# Patient Record
Sex: Female | Born: 1995 | Hispanic: Yes | Marital: Married
Health system: Midwestern US, Community
[De-identification: ages and names within clinical notes are randomized; demographics above are authoritative.]

## PROBLEM LIST (undated history)

## (undated) DIAGNOSIS — O368120 Decreased fetal movements, second trimester, not applicable or unspecified: Secondary | ICD-10-CM

---

## 2016-03-05 ENCOUNTER — Inpatient Hospital Stay
Admit: 2016-03-05 | Discharge: 2016-03-05 | Disposition: A | Discharge status: Home / Self Care | Payer: BLUE CROSS/BLUE SHIELD | Attending: Emergency Medicine

## 2016-03-05 DIAGNOSIS — O9989 Other specified diseases and conditions complicating pregnancy, childbirth and the puerperium: Secondary | ICD-10-CM

## 2016-03-05 LAB — EKG 12-LEAD
Atrial Rate: 87 {beats}/min
Diagnosis: NORMAL
P Axis: 25 degrees
P-R Interval: 150 ms
Q-T Interval: 364 ms
QRS Duration: 82 ms
QTc Calculation (Bazett): 438 ms
T Axis: -5 degrees
Ventricular Rate: 87 {beats}/min

## 2016-03-05 LAB — METABOLIC PANEL, COMPREHENSIVE
A-G Ratio: 0.8 (ref 0.8–1.7)
ALT (SGPT): 15 U/L (ref 13–56)
AST (SGOT): 10 U/L — ABNORMAL LOW (ref 15–37)
Albumin: 3.1 g/dL — ABNORMAL LOW (ref 3.4–5.0)
Alk. phosphatase: 100 U/L (ref 45–117)
Anion gap: 10 mmol/L (ref 3.0–18)
BUN/Creatinine ratio: 16 (ref 12–20)
BUN: 7 MG/DL (ref 7.0–18)
Bilirubin, total: 0.2 MG/DL (ref 0.2–1.0)
CO2: 24 mmol/L (ref 21–32)
Calcium: 9.4 MG/DL (ref 8.5–10.1)
Chloride: 105 mmol/L (ref 100–108)
Creatinine: 0.43 MG/DL — ABNORMAL LOW (ref 0.6–1.3)
GFR est AA: >60 mL/min/{1.73_m2} (ref >60)
GFR est non-AA: >60 mL/min/{1.73_m2} (ref >60)
Globulin: 4.1 g/dL — ABNORMAL HIGH (ref 2.0–4.0)
Glucose: 92 mg/dL (ref 74–99)
Potassium: 3.6 mmol/L (ref 3.5–5.5)
Protein, total: 7.2 g/dL (ref 6.4–8.2)
Sodium: 139 mmol/L (ref 136–145)

## 2016-03-05 LAB — EKG, 12 LEAD, INITIAL
Atrial Rate: 87 {beats}/min
Calculated P Axis: 25 degrees
Calculated T Axis: -5 degrees
Diagnosis: NORMAL
P-R Interval: 150 ms
Q-T Interval: 364 ms
QRS Duration: 82 ms
QTC Calculation (Bezet): 438 ms
Ventricular Rate: 87 {beats}/min

## 2016-03-05 LAB — CBC WITH AUTOMATED DIFF
ABS. BASOPHILS: 0 10*3/uL (ref 0.0–0.06)
ABS. EOSINOPHILS: 0.1 10*3/uL (ref 0.0–0.4)
ABS. LYMPHOCYTES: 1.8 10*3/uL (ref 0.9–3.6)
ABS. MONOCYTES: 0.8 10*3/uL (ref 0.05–1.2)
ABS. NEUTROPHILS: 8.8 10*3/uL — ABNORMAL HIGH (ref 1.8–8.0)
BASOPHILS: 0 % (ref 0–2)
EOSINOPHILS: 1 % (ref 0–5)
HCT: 35 % (ref 35.0–45.0)
HGB: 11.8 g/dL — ABNORMAL LOW (ref 12.0–16.0)
LYMPHOCYTES: 16 % — ABNORMAL LOW (ref 21–52)
MCH: 29.7 PG (ref 24.0–34.0)
MCHC: 33.7 g/dL (ref 31.0–37.0)
MCV: 88.2 FL (ref 74.0–97.0)
MONOCYTES: 7 % (ref 3–10)
MPV: 10.5 FL (ref 9.2–11.8)
NEUTROPHILS: 76 % — ABNORMAL HIGH (ref 40–73)
PLATELET: 206 10*3/uL (ref 135–420)
RBC: 3.97 M/uL — ABNORMAL LOW (ref 4.20–5.30)
RDW: 14.3 % (ref 11.6–14.5)
WBC: 11.4 10*3/uL (ref 4.6–13.2)

## 2016-03-05 NOTE — ED Provider Notes (Signed)
HPI Comments: 5:15 AM Diana Howell is a 20 y.o. female with no relevant PMHx who is [redacted] weeks pregnant with twins presents to ED complaining of CP since yesterday and SOB since midnight. The pt states the CP is a stabbing pain in middle of her chest. Pt says the pain is exacerbated when she ambulating and sometimes laying down. The pt reports that though she has low back pain with  some pain that radiates down her legs, she has been dealing with this before pregnancy; currently pain is at baseline. The pt reports this is her 2nd pregnancy. Pt says she has no hx of blood clots.  Pt denies abdominal pain, pelvic pain, nausea, vomiting, travel, and leg selling. The pt had no other complaints or concerns in the ED.        PCP: No primary care provider on file.      The history is provided by the patient. No language interpreter was used.        Past Medical History:   Diagnosis Date   ??? Asthma        History reviewed. No pertinent surgical history.      History reviewed. No pertinent family history.    Social History     Social History   ??? Marital status: MARRIED     Spouse name: N/A   ??? Number of children: N/A   ??? Years of education: N/A     Occupational History   ??? Not on file.     Social History Main Topics   ??? Smoking status: Former Smoker   ??? Smokeless tobacco: Former Systems developer   ??? Alcohol use No   ??? Drug use: No   ??? Sexual activity: Not on file     Other Topics Concern   ??? Not on file     Social History Narrative   ??? No narrative on file         ALLERGIES: Amoxicillin and Penicillins    Review of Systems   Constitutional: Negative for chills and fever.   HENT: Negative for trouble swallowing.    Respiratory: Positive for shortness of breath.    Cardiovascular: Positive for chest pain. Negative for leg swelling.   Gastrointestinal: Negative for abdominal pain, diarrhea, nausea and vomiting.   Genitourinary: Negative for difficulty urinating.   Musculoskeletal: Negative for back pain and neck pain.    Skin: Negative for wound.   Neurological: Negative for syncope and headaches.   Psychiatric/Behavioral: Negative for behavioral problems.   All other systems reviewed and are negative.      Vitals:    03/05/16 0527 03/05/16 0530 03/05/16 0545 03/05/16 0600   BP: 113/87 108/51 94/63 114/74   Pulse: 97 86 95 81   Resp: _0 Temp: 98.7 ??F (37.1 ??C)      SpO2: 100% 100% 99% 98%   Weight: 74.8 kg (165 lb)      Height: 5' 3" (1.6 m)               Physical Exam   Constitutional: She is oriented to person, place, and time. She appears well-developed. No distress.   well-appearing, nad   HENT:   Head: Normocephalic and atraumatic.   Eyes: EOM are normal.   Neck: Normal range of motion.   Cardiovascular: Normal rate and intact distal pulses.    Pulmonary/Chest: Effort normal and breath sounds normal. No respiratory distress.   Abdominal: Soft. There is no tenderness.  Musculoskeletal: Normal range of motion. She exhibits no edema or tenderness.   Mechanically stable, negative homan's sign bilaterally   Neurological: She is alert and oriented to person, place, and time.   No focal deficits noted   Psychiatric: Her behavior is normal.   Nursing note and vitals reviewed.       MDM  Number of Diagnoses or Management Options  Chest pain, unspecified type:   Pregnancy test performed, pregnancy confirmed:   Diagnosis management comments: 20 yo CF G2P0 at 25 wks presents with a couple days intermittent chest pain and shortness of breath.  No fevers, no cough, no DVT symptoms.  Examination unremarkable with ctab and no increased wob and no leg swelling or tenderness with negative Homan's sign.  Presentations seems mostly likely non-specific, PE seems clinically unlikely as does coronary disease.  Will evaluate for acute process.  I discussed risks and benefits of imaging given pregnancy, will hold at this time.    6:28 AM  labwork unremarkable.  Pt doing well, has been sleeping comfortably in ED.   Given normal vital signs, exam and stay in ED, very low clinical suspicion for PE and again feel risk of imaging or further testing outweighs likely benefit, presentation more likely reflux/GERD/non-specific.  Discussed results with pt and poc for dc home, symptom management, follow-up, return precautions.       Amount and/or Complexity of Data Reviewed  Clinical lab tests: ordered and reviewed  Review and summarize past medical records: yes  Independent visualization of images, tracings, or specimens: yes      ED Course       EKG  Date/Time: 03/05/2016 5:23 AM  Performed by: Jeani Hawking  Authorized by: Paulla Dolly S     ECG reviewed by ED Physician in the absence of a cardiologist: yes    Previous ECG:     Previous ECG:  Unavailable  Interpretation:     Interpretation: normal    Rate:     ECG rate:  87    ECG rate assessment: normal    Rhythm:     Rhythm: sinus rhythm    Ectopy:     Ectopy: none    QRS:     QRS axis:  Normal    QRS intervals:  Normal  Conduction:     Conduction: normal    ST segments:     ST segments:  Normal  T waves:     T waves: normal          Vitals:  Patient Vitals for the past 12 hrs:   Temp Pulse Resp BP SpO2   03/05/16 0600 - 81 20 114/74 98 %   03/05/16 0545 - 95 20 94/63 99 %   03/05/16 0530 - 86 20 108/51 100 %   03/05/16 0527 98.7 ??F (37.1 ??C) 97 16 113/87 100 %       Medications Ordered:  Medications - No data to display    Lab Findings:  Recent Results (from the past 12 hour(s))   CBC WITH AUTOMATED DIFF    Collection Time: 03/05/16  4:50 AM   Result Value Ref Range    WBC 11.4 4.6 - 13.2 K/uL    RBC 3.97 (L) 4.20 - 5.30 M/uL    HGB 11.8 (L) 12.0 - 16.0 g/dL    HCT 35.0 35.0 - 45.0 %    MCV 88.2 74.0 - 97.0 FL    MCH 29.7 24.0 - 34.0 PG  MCHC 33.7 31.0 - 37.0 g/dL    RDW 14.3 11.6 - 14.5 %    PLATELET 206 135 - 420 K/uL    MPV 10.5 9.2 - 11.8 FL    NEUTROPHILS 76 (H) 40 - 73 %    LYMPHOCYTES 16 (L) 21 - 52 %    MONOCYTES 7 3 - 10 %    EOSINOPHILS 1 0 - 5 %     BASOPHILS 0 0 - 2 %    ABS. NEUTROPHILS 8.8 (H) 1.8 - 8.0 K/UL    ABS. LYMPHOCYTES 1.8 0.9 - 3.6 K/UL    ABS. MONOCYTES 0.8 0.05 - 1.2 K/UL    ABS. EOSINOPHILS 0.1 0.0 - 0.4 K/UL    ABS. BASOPHILS 0.0 0.0 - 0.06 K/UL    DF AUTOMATED     METABOLIC PANEL, COMPREHENSIVE    Collection Time: 03/05/16  4:50 AM   Result Value Ref Range    Sodium 139 136 - 145 mmol/L    Potassium 3.6 3.5 - 5.5 mmol/L    Chloride 105 100 - 108 mmol/L    CO2 24 21 - 32 mmol/L    Anion gap 10 3.0 - 18 mmol/L    Glucose 92 74 - 99 mg/dL    BUN 7 7.0 - 18 MG/DL    Creatinine 0.43 (L) 0.6 - 1.3 MG/DL    BUN/Creatinine ratio 16 12 - 20      GFR est AA >60 >60 ml/min/1.34m    GFR est non-AA >60 >60 ml/min/1.755m   Calcium 9.4 8.5 - 10.1 MG/DL    Bilirubin, total 0.2 0.2 - 1.0 MG/DL    ALT (SGPT) 15 13 - 56 U/L    AST (SGOT) 10 (L) 15 - 37 U/L    Alk. phosphatase 100 45 - 117 U/L    Protein, total 7.2 6.4 - 8.2 g/dL    Albumin 3.1 (L) 3.4 - 5.0 g/dL    Globulin 4.1 (H) 2.0 - 4.0 g/dL    A-G Ratio 0.8 0.8 - 1.7     EKG, 12 LEAD, INITIAL    Collection Time: 03/05/16  5:21 AM   Result Value Ref Range    Ventricular Rate 87 BPM    Atrial Rate 87 BPM    P-R Interval 150 ms    QRS Duration 82 ms    Q-T Interval 364 ms    QTC Calculation (Bezet) 438 ms    Calculated P Axis 25 degrees    Calculated T Axis -5 degrees    Diagnosis       Normal sinus rhythm  Moderate voltage criteria for LVH, may be normal variant  Nonspecific T wave abnormality  Abnormal ECG  No previous ECGs available           Diagnosis:   1. Chest pain, unspecified type    2. Pregnancy test performed, pregnancy confirmed        Disposition: Discharged    Follow-up Information     None           Patient's Medications   Start Taking    No medications on file   Continue Taking    PNV [id number] 29 MG IRON-1 MG -50 MG CPKD    Take  by mouth.   These Medications have changed    No medications on file   Stop Taking    No medications on file       [place] acting as a Education administrator for and in the presence of Jeani Hawking, MD      March 05, 2016 at 5:15 AM       Provider Attestation:      I personally performed the services described in the documentation, reviewed the documentation, as recorded by the scribe in my presence, and it accurately and completely records my words and actions. March 05, 2016 at 5:15 AM - Jeani Hawking, MD

## 2016-03-05 NOTE — ED Notes (Signed)
Assumed care of patient for discharge. I have reviewed discharge instructions with the patient.  The patient verbalized understanding. Patient armband removed and shredded. VSS

## 2016-03-05 NOTE — ED Triage Notes (Signed)
Alert female c/o stabbing cp since last noc, approx midnight +sob, reports 25 weeks and 5 days pregnant with twins. +nausea r/t pregnancy. Sx worse when laying flat and with deep inspiration.

## 2016-03-17 ENCOUNTER — Inpatient Hospital Stay: Payer: BLUE CROSS/BLUE SHIELD

## 2016-03-17 NOTE — Progress Notes (Signed)
Patient ambulatory to unit with c/o no fetal movement since 1100 on 03/16/2016. G2P0. 25 weeks. Denies leaking of vaginal fluids or bleeding. States positive fetal movement. EFM and Toco applied. Baby A FHR is 150. Baby B FHT is 150. Oriented to room and surroundings. Significant other supportive at bedside.

## 2016-03-17 NOTE — Progress Notes (Addendum)
Fetal movement noted X2.  Heart beats noted on both twins.  VS stable.  NO bleeding, leaking of fluid.  Will discharge upon MD order.    [id number] - Patient discharged to home with follow up to next scheduled appointment.  Discussed kick count and always consulting with provider if any concerns with her or the fetuses arises.  Patient voiced understanding.  Patient ambulated off unit accompanied by spouse.  No signs or symptoms of distress noted.

## 2016-03-17 NOTE — Progress Notes (Addendum)
1834: RN noticed Baby A & Baby B tracing together. Searched for Baby A from 1827 to 1833. Asked another RN to search for Baby A r/t noted patient anxiety.    1917: Dr. Kandra NicolasBasso in room for ultrasound. Twin A & Twin B are moving all over. OK to discharge home per Dr. Kandra NicolasBasso.

## 2016-03-17 NOTE — H&P (Signed)
Antepartum progress note    Patient seen, fetal heart rate and contraction pattern evaluated, patient examined.  No abdominal pain, no contractions, no LOF, no vaginal bleeding , able to feel baby B, but did not feel baby A   Patient Vitals for the past 8 hrs:   Pulse BP   03/17/16 1906 87 129/79         Physical Exam:   Abdomen soft , gravidic, not tender   Limited bedside US : A : on the left : +FM,SDP >2cm                                       B : on upper R : +FM , SDP >2cm  BPP: 10/10  Toco: no contractions          Assessment/Plan:  G2 P0 , 27w DiDi twins : reassuring fetal status x2, fup appoint 1/3. PTL precautions and FMC in place     Lasandra BeechAna C Karam Dunson, MD

## 2020-03-19 ENCOUNTER — Emergency Department
Admission: EM | Admit: 2020-03-19 | Discharge: 2020-03-19 | Disposition: A | Discharge status: Home / Self Care | Payer: Medicaid - Out of State | Attending: Emergency Medicine | Admitting: Emergency Medicine

## 2020-03-19 ENCOUNTER — Other Ambulatory Visit: Payer: Self-pay

## 2020-03-19 DIAGNOSIS — R55 Syncope and collapse: Secondary | ICD-10-CM | POA: Diagnosis not present

## 2020-03-19 DIAGNOSIS — R079 Chest pain, unspecified: Secondary | ICD-10-CM | POA: Insufficient documentation

## 2020-03-19 LAB — BASIC METABOLIC PANEL
Anion gap: 7 (ref 5–15)
BUN: 12 mg/dL (ref 6–20)
CO2: 23 mmol/L (ref 22–32)
Calcium: 9.2 mg/dL (ref 8.9–10.3)
Chloride: 108 mmol/L (ref 98–111)
Creatinine, Ser: 0.53 mg/dL (ref 0.44–1.00)
GFR, Estimated: >60 mL/min (ref >60)
Glucose, Bld: 92 mg/dL (ref 70–99)
Potassium: 3.8 mmol/L (ref 3.5–5.1)
Sodium: 138 mmol/L (ref 135–145)

## 2020-03-19 LAB — CBC
HCT: 40.1 % (ref 36.0–46.0)
Hemoglobin: 13.4 g/dL (ref 12.0–15.0)
MCH: 28.1 pg (ref 26.0–34.0)
MCHC: 33.4 g/dL (ref 30.0–36.0)
MCV: 84.1 fL (ref 80.0–100.0)
Platelets: 274 10*3/uL (ref 150–400)
RBC: 4.77 MIL/uL (ref 3.87–5.11)
RDW: 13.4 % (ref 11.5–15.5)
WBC: 7.7 10*3/uL (ref 4.0–10.5)
nRBC: 0 % (ref 0.0–0.2)

## 2020-03-19 LAB — TROPONIN I (HIGH SENSITIVITY)
Troponin I (High Sensitivity): <2 ng/L (ref <18)
Troponin I (High Sensitivity): <2 ng/L (ref <18)

## 2020-03-19 LAB — TSH: TSH: 0.792 u[IU]/mL (ref 0.350–4.500)

## 2020-03-19 MED ORDER — KETOROLAC TROMETHAMINE 30 MG/ML IJ SOLN
30.0000 mg | Freq: Once | INTRAMUSCULAR | Status: AC
  Administered 2020-03-19: 17:00:00 30 mg via INTRAMUSCULAR
  Filled 2020-03-19: qty 1

## 2020-03-19 MED ORDER — METOCLOPRAMIDE HCL 5 MG PO TABS: 5.0000 mg | ORAL_TABLET | Freq: Three times a day (TID) | ORAL | 0 refills | Status: AC | PRN

## 2020-03-19 MED ORDER — METOCLOPRAMIDE HCL 10 MG PO TABS
10.0000 mg | ORAL_TABLET | Freq: Once | ORAL | Status: AC
  Administered 2020-03-19: 17:00:00 10 mg via ORAL
  Filled 2020-03-19: qty 1

## 2020-03-19 NOTE — ED Provider Notes (Signed)
[facility] Emergency Department Provider Note ____________________________________________  Time seen: 1616  I have reviewed the triage vital signs and the nursing notes.  HISTORY  Chief Complaint  Chest Pain  HPI Jeanne Huffman is a 24 y.o. female presents to the ED via personal vehicle,  for evaluation of onset of chest pain and drowsiness.  Patient reports an undiagnosed "heart problem" for the last 3 years.  She describes intermittent episodes of tachycardia with associated near syncope.  She also reports episodes of sudden drop in her blood pressure.  She reports initial symptoms began about 3 years ago during her twin gestation.  She was evaluated by cardiologist once and of her pregnancy, but is unclear of her ongoing diagnosis.  She reports complete work-up which included a Holter monitor and echocardiogram.  According to the patient's recollection, there was some form of regurgitation reported on the echo exam.  She takes daily doses of propanolol for her heart rate, as well as Effexor, trazodone, and has recently had her Implanon replaced.  She denies any current nausea, vomiting, dizziness, weakness.  She reports generalized heaviness to the chest that persist.  She is currently not aware of any palpitations or skipped beats.  Patient also denies any current or recent alcohol or ilicit drug use.  History reviewed. No pertinent past medical history.  There are no problems to display for this patient.   History reviewed. No pertinent surgical history.  Prior to Admission medications   Medication Sig Start Date End Date Taking? Authorizing Provider  Etonogestrel (IMPLANON [place]) Inject into the skin. 11/22/19  Yes [provider]  metoCLOPramide (REGLAN) 5 MG tablet Take 1 tablet (5 mg total) by mouth every 8 (eight) hours as needed for up to 5 days for nausea or vomiting. 03/19/20 03/24/20 Yes Bobbi Kozakiewicz, Charlesetta Ivory, PA-C  propranolol ER (INDERAL LA) 60  MG 24 hr capsule Take 60 mg by mouth daily.   Yes [provider]  traZODone (DESYREL) 25 mg TABS tablet Take 25 mg by mouth at bedtime.   Yes [provider]  venlafaxine (EFFEXOR) 100 MG tablet Take 300 mg by mouth 2 (two) times daily.   Yes [provider]    Allergies Amoxicillin  History reviewed. No pertinent family history.  Social History    Review of Systems  Constitutional: Negative for fever. Eyes: Negative for visual changes. ENT: Negative for sore throat. Cardiovascular: Negative for chest pain. Respiratory: Negative for shortness of breath. Gastrointestinal: Negative for abdominal pain, vomiting and diarrhea. Genitourinary: Negative for dysuria. Musculoskeletal: Negative for back pain. Skin: Negative for rash. Neurological: Negative for headaches, focal weakness or numbness. ____________________________________________  PHYSICAL EXAM:  VITAL SIGNS: ED Triage Vitals  Enc Vitals Group     BP 03/19/20 1310 124/88     Pulse Rate 03/19/20 1310 85     Resp 03/19/20 1310 16     Temp 03/19/20 1310 98 F (36.7 C)     Temp Source 03/19/20 1310 Oral     SpO2 03/19/20 1310 100 %     Weight 03/19/20 1349 175 lb (79.4 kg)     Height 03/19/20 1349 5\' 3"  (1.6 m)     Head Circumference --      Peak Flow --      Pain Score 03/19/20 1349 8     Pain Loc --      Pain Edu? --      Excl. in GC? --     Constitutional: Alert  and oriented. Well appearing and in no distress. Head: Normocephalic and atraumatic. Eyes: Conjunctivae are normal. Normal extraocular movements Neck: Supple. No thyromegaly. Cardiovascular: Normal rate, regular rhythm. Normal distal pulses. Respiratory: Normal respiratory effort. No wheezes/rales/rhonchi. Gastrointestinal: Soft and nontender. No distention. Musculoskeletal: Nontender with normal range of motion in all extremities.  Neurologic:  Normal gait without ataxia. Normal speech and language. No gross focal  neurologic deficits are appreciated. Skin:  Skin is warm, dry and intact. No rash noted. Psychiatric: Mood and affect are normal. Patient exhibits appropriate insight and judgment. ____________________________________________   LABS (pertinent positives/negatives) Labs Reviewed  BASIC METABOLIC PANEL  CBC  TSH  TROPONIN I (HIGH SENSITIVITY)  TROPONIN I (HIGH SENSITIVITY)  ____________________________________________  EKG  NSR 83 ms with sinus arrythmia PR interval 156 ms QRS duration 84 ms No STEMI ____________________________________________  PROCEDURES  Toradol 30 mg IM metoclopramide 10 mg PO  Procedures ____________________________________________  INITIAL IMPRESSION / ASSESSMENT AND PLAN / ED COURSE  Differential diagnosis includes, but is not limited to, ACS, aortic dissection, pulmonary embolism, cardiac tamponade, pneumothorax, pneumonia, pericarditis, myocarditis, GI-related causes including esophagitis/gastritis, and musculoskeletal chest wall pain, POTS  Female who presents to the ED with an evaluation for near syncopal episode as well as tachycardia, is stable and reporting improved symptoms after ED work-up.  Labs are reassuring and troponin x2 are negative, and there is no signs of acute coronary syndrome on exam.  Patient's clinical picture may represent a POTS syndrome, anxiety, or some other underlying cardiac arrhythmia.  Patient is referred at this time to cardiology for further work-up since her symptoms have been persistent and intermittent for the last several years.  She is reassured by her normal work-up at this time, discharge follow-up with primary provider for ongoing symptoms.   Jeanne Huffman was evaluated in Emergency Department on 03/19/2020 for the symptoms described in the history of present illness. She was evaluated in the context of the global COVID-19 pandemic, which necessitated consideration that the patient might be at risk for infection  with the SARS-CoV-2 virus that causes COVID-19. Institutional protocols and algorithms that pertain to the evaluation of patients at risk for COVID-19 are in a state of rapid change based on information released by regulatory bodies including the CDC and federal and state organizations. These policies and algorithms were followed during the patient's care in the ED. ____________________________________________  FINAL CLINICAL IMPRESSION(S) / ED DIAGNOSES  Final diagnoses:  Nonspecific chest pain  Postural dizziness with near syncope      Karmen Stabs, Charlesetta Ivory, PA-C 03/19/20 2041    Chesley Noon, MD 03/25/20 [phone]

## 2020-03-19 NOTE — ED Triage Notes (Signed)
Pt comes pov with chest pain and sleepiness. Pt states "heart problem" for 3 years that still happens sometimes where her heart gets fast in her sleep and her BP drops or changes. On propranolol for HR. Denies drug use in past 2 weeks.

## 2020-03-19 NOTE — Discharge Instructions (Signed)
Your exam, labs, and EKG are normal and reassuring at this time. Continue to monitor and treat your symptoms. Follow-up with your selected primary care provider and Cardiology as discussed. Return to the ED as needed.

## 2020-07-01 ENCOUNTER — Other Ambulatory Visit: Payer: Self-pay

## 2020-07-01 ENCOUNTER — Emergency Department
Admission: EM | Admit: 2020-07-01 | Discharge: 2020-07-01 | Disposition: A | Discharge status: Home / Self Care | Payer: Medicaid - Out of State | Attending: Emergency Medicine | Admitting: Emergency Medicine

## 2020-07-01 ENCOUNTER — Emergency Department: Payer: Medicaid - Out of State

## 2020-07-01 ENCOUNTER — Encounter: Payer: Self-pay | Admitting: Emergency Medicine

## 2020-07-01 DIAGNOSIS — N83202 Unspecified ovarian cyst, left side: Secondary | ICD-10-CM

## 2020-07-01 DIAGNOSIS — R102 Pelvic and perineal pain: Secondary | ICD-10-CM

## 2020-07-01 DIAGNOSIS — M545 Low back pain, unspecified: Secondary | ICD-10-CM | POA: Diagnosis present

## 2020-07-01 LAB — URINALYSIS, COMPLETE (UACMP) WITH MICROSCOPIC
Bacteria, UA: NONE SEEN
Bilirubin Urine: NEGATIVE
Glucose, UA: NEGATIVE mg/dL
Hgb urine dipstick: NEGATIVE
Ketones, ur: NEGATIVE mg/dL
Leukocytes,Ua: NEGATIVE
Nitrite: NEGATIVE
Protein, ur: NEGATIVE mg/dL
Specific Gravity, Urine: 1.024 (ref 1.005–1.030)
pH: 5 (ref 5.0–8.0)

## 2020-07-01 LAB — WET PREP, GENITAL
Clue Cells Wet Prep HPF POC: NONE SEEN
Sperm: NONE SEEN
Trich, Wet Prep: NONE SEEN
Yeast Wet Prep HPF POC: NONE SEEN

## 2020-07-01 LAB — CHLAMYDIA/NGC RT PCR (ARMC ONLY)
Chlamydia Tr: NOT DETECTED
N gonorrhoeae: NOT DETECTED

## 2020-07-01 LAB — PREGNANCY, URINE: Preg Test, Ur: NEGATIVE

## 2020-07-01 MED ORDER — TRAMADOL HCL 50 MG PO TABS: 50.0000 mg | ORAL_TABLET | Freq: Four times a day (QID) | ORAL | 0 refills | Status: AC | PRN

## 2020-07-01 MED ORDER — ACETAMINOPHEN 325 MG PO TABS
650.0000 mg | ORAL_TABLET | Freq: Once | ORAL | Status: AC
  Administered 2020-07-01: 650 mg via ORAL
  Filled 2020-07-01: qty 2

## 2020-07-01 MED ORDER — ONDANSETRON 8 MG PO TBDP: 8.0000 mg | ORAL_TABLET | Freq: Three times a day (TID) | ORAL | 0 refills | Status: AC | PRN

## 2020-07-01 MED ORDER — IBUPROFEN 600 MG PO TABS: 600.0000 mg | ORAL_TABLET | Freq: Four times a day (QID) | ORAL | 0 refills | Status: AC | PRN

## 2020-07-01 MED ORDER — ONDANSETRON 4 MG PO TBDP
8.0000 mg | ORAL_TABLET | Freq: Once | ORAL | Status: AC
  Administered 2020-07-01: 8 mg via ORAL
  Filled 2020-07-01: qty 2

## 2020-07-01 NOTE — Discharge Instructions (Signed)
Return to the ER for new, worsening, or persistent severe pain, vomiting, diarrhea, vaginal bleeding, or any other new or worsening symptoms that concern you.  Follow-up with your regular OB/GYN in the next week.  Take the ibuprofen as the primary pain medication with the tramadol only if needed for more severe pain.

## 2020-07-01 NOTE — ED Notes (Signed)
Pt alert and oriented X 4, stable for discharge. RR even and unlabored, color WNL. Discussed discharge instructions and follow-up as directed. Discharge medications discussed if prescribed. Pt had opportunity to ask questions, and RN to provide patient/family eduction.  

## 2020-07-01 NOTE — ED Notes (Signed)
Pt presents to the ED with left sided, lower abdominal, and pelvic pain that started April 8. Pt complains of dysuria associated with urgency and frequency. Pt states that urine is dark and has a foul odor to it. Pt also states that they have been having white vaginal discharge with a thick consistency that started April 4. Pt also complains of nausea. Pt denies of fever. Pt is A&Ox4 and NAD.

## 2020-07-01 NOTE — ED Notes (Signed)
Pt requesting medication for nausea at this time. Siadecki, EDP made aware.

## 2020-07-01 NOTE — ED Triage Notes (Addendum)
Patient to ER for c/o lower back pain (left side worse) with white vaginal discharge and pelvic pain. Patient reports having one episode of dysuria. States s/s started 1.5 days ago. Denies any fevers. +Nausea.

## 2020-07-01 NOTE — ED Notes (Signed)
Pt to and from US.  Awaiting results.

## 2020-07-01 NOTE — ED Provider Notes (Signed)
[facility] Emergency Department Provider Note ____________________________________________   Event Date/Time   First MD Initiated Contact with Patient 07/01/20 [id number]     (approximate)  I have reviewed the triage vital signs and the nursing notes.   HISTORY  Chief Complaint Back Pain    HPI Jeanne Huffman is a 25 y.o. female with no active medical problems who presents with left lower back and suprapubic pain over the last 2 days, persistent course, and associated with slight dysuria initially which has now resolved.  The patient also has had a small amount of vaginal discharge over the last week which preceded the pain.   History reviewed. No pertinent past medical history.  There are no problems to display for this patient.   History reviewed. No pertinent surgical history.  Prior to Admission medications   Medication Sig Start Date End Date Taking? Authorizing Provider  ibuprofen (ADVIL) 600 MG tablet Take 1 tablet (600 mg total) by mouth every 6 (six) hours as needed. 07/01/20  Yes Dionne Bucy, MD  ondansetron (ZOFRAN ODT) 8 MG disintegrating tablet Take 1 tablet (8 mg total) by mouth every 8 (eight) hours as needed for nausea or vomiting. 07/01/20  Yes Dionne Bucy, MD  traMADol (ULTRAM) 50 MG tablet Take 1 tablet (50 mg total) by mouth every 6 (six) hours as needed for up to 5 days. 07/01/20 07/06/20 Yes Dionne Bucy, MD  Etonogestrel [facility]) Inject into the skin. 11/22/19   [provider]  metoCLOPramide (REGLAN) 5 MG tablet Take 1 tablet (5 mg total) by mouth every 8 (eight) hours as needed for up to 5 days for nausea or vomiting. 03/19/20 03/24/20  Menshew, Charlesetta Ivory, PA-C  propranolol ER (INDERAL LA) 60 MG 24 hr capsule Take 60 mg by mouth daily.    [provider]  traZODone (DESYREL) 25 mg TABS tablet Take 25 mg by mouth at bedtime.    [provider]  venlafaxine (EFFEXOR) 100 MG tablet  Take 300 mg by mouth 2 (two) times daily.    [provider]    Allergies Amoxicillin and Bee venom  No family history on file.  Social History Social History   Tobacco Use  . Smoking status: Never Smoker  . Smokeless tobacco: Never Used  Substance Use Topics  . Alcohol use: Never    Review of Systems  Constitutional: No fever. Eyes: No visual changes. ENT: No sore throat. Cardiovascular: Denies chest pain. Respiratory: Denies shortness of breath. Gastrointestinal: No vomiting or diarrhea. Genitourinary: Positive for resolved dysuria.  Positive for vaginal discharge. Musculoskeletal: Positive for back pain. Skin: Negative for rash. Neurological: Negative for headaches, focal weakness or numbness.   ____________________________________________   PHYSICAL EXAM:  VITAL SIGNS: ED Triage Vitals  Enc Vitals Group     BP 07/01/20 0859 124/80     Pulse Rate 07/01/20 0859 79     Resp 07/01/20 0859 18     Temp 07/01/20 0859 98.1 F (36.7 C)     Temp Source 07/01/20 0859 Oral     SpO2 07/01/20 0859 100 %     Weight 07/01/20 0900 170 lb (77.1 kg)     Height 07/01/20 0900 5\' 3"  (1.6 m)     Head Circumference --      Peak Flow --      Pain Score 07/01/20 0900 7     Pain Loc --      Pain Edu? --      Excl.  in GC? --     Constitutional: Alert and oriented. Well appearing and in no acute distress. Eyes: Conjunctivae are normal.  Head: Atraumatic. Nose: No congestion/rhinnorhea. Mouth/Throat: Mucous membranes are moist.   Neck: Normal range of motion.  Cardiovascular: Normal rate, regular rhythm.  Good peripheral circulation. Respiratory: Normal respiratory effort.  No retractions.  Gastrointestinal: Soft with mild suprapubic discomfort but no focal tenderness or peritoneal signs.  No distention.  Genitourinary: No flank tenderness.  Normal external genitalia.  Small amount of whitish vaginal discharge.  No significant CMT or adnexal  tenderness. Musculoskeletal: No lower extremity edema.  Extremities warm and well perfused.  Neurologic:  Normal speech and language. No gross focal neurologic deficits are appreciated.  Skin:  Skin is warm and dry. No rash noted. Psychiatric: Mood and affect are normal. Speech and behavior are normal.  ____________________________________________   LABS (all labs ordered are listed, but only abnormal results are displayed)  Labs Reviewed  WET PREP, GENITAL - Abnormal; Notable for the following components:      Result Value   WBC, Wet Prep HPF POC FEW (*)    All other components within normal limits  URINALYSIS, COMPLETE (UACMP) WITH MICROSCOPIC - Abnormal; Notable for the following components:   Color, Urine YELLOW (*)    APPearance HAZY (*)    All other components within normal limits  CHLAMYDIA/NGC RT PCR ([facility] ONLY)  PREGNANCY, URINE   ____________________________________________  EKG   ____________________________________________  RADIOLOGY  US pelvis: Left ovarian cyst.  ____________________________________________   PROCEDURES  Procedure(s) performed: No  Procedures  Critical Care performed: No ____________________________________________   INITIAL IMPRESSION / ASSESSMENT AND PLAN / ED COURSE  Pertinent labs & imaging results that were available during my care of the patient were reviewed by me and considered in my medical decision making (see chart for details).  25 year old female with no active medical problems presents with left lower back pain, suprapubic pain, and vaginal discharge over the last several days.  She had some dysuria initially which resolved.  On exam, the patient is overall well-appearing.  Her vital signs are normal.  The abdomen is soft with mild suprapubic discomfort but no focal tenderness.  Pelvic exam is unremarkable except for small amount of whitish discharge.  Differential includes UTI/pyonephritis, BV,  trichomoniasis, ruptured ovarian cyst, uterine fibroid; I do not suspect PID, TOA, or ovarian torsion given the reassuring pelvic exam.  The patient has no significant GI symptoms so I do not suspect diverticulitis or colitis.  We will obtain wet prep, GC/CT, urine pregnancy, and urinalysis.  ----------------------------------------- 12:31 PM on 07/01/2020 -----------------------------------------  Urinalysis is normal and the vaginal swabs are unrevealing.  I have ordered an ultrasound for further evaluation.  ----------------------------------------- 1:32 PM on 07/01/2020 -----------------------------------------  Ultrasound shows a left ovarian cyst which corresponds with the location of the patient's pain.  Patient appears comfortable and is eating.  There is no evidence of ovarian torsion or other acute complication.  At this time, the patient is stable for discharge home.  I counseled her on the results of the work-up.  I have prescribed analgesia and some Zofran for nausea.  Return precautions given, and she expresses understanding. ____________________________________________   FINAL CLINICAL IMPRESSION(S) / ED DIAGNOSES  Final diagnoses:  Pelvic pain  Left ovarian cyst      NEW MEDICATIONS STARTED DURING THIS VISIT:  Discharge Medication List as of 07/01/2020  1:47 PM    START taking these medications   Details  ibuprofen (  ADVIL) 600 MG tablet Take 1 tablet (600 mg total) by mouth every 6 (six) hours as needed., Starting Mon 07/01/2020, Normal    ondansetron (ZOFRAN ODT) 8 MG disintegrating tablet Take 1 tablet (8 mg total) by mouth every 8 (eight) hours as needed for nausea or vomiting., Starting Mon 07/01/2020, Normal    traMADol (ULTRAM) 50 MG tablet Take 1 tablet (50 mg total) by mouth every 6 (six) hours as needed for up to 5 days., Starting Mon 07/01/2020, Until Sat 07/06/2020 at 2359, Normal         Note:  This document was prepared using Dragon voice recognition  software and may include unintentional dictation errors.   Dionne Bucy, MD 07/01/20 [phone]

## 2022-03-14 IMAGING — US US PELVIS COMPLETE WITH TRANSVAGINAL
1 series · 13 of 25 positions shown · non-contrast
Comparison: None

CLINICAL DATA: Pelvic pain for 2 days.

EXAM:
TRANSABDOMINAL AND TRANSVAGINAL ULTRASOUND OF PELVIS
TECHNIQUE: Both transabdominal and transvaginal ultrasound examinations of the
pelvis were performed. Transabdominal technique was performed for
global imaging of the pelvis including uterus, ovaries, adnexal
regions, and pelvic cul-de-sac. It was necessary to proceed with
endovaginal exam following the transabdominal exam to visualize the
endometrium and ovaries to better advantage.

[Series 1: us pelvic complete with transvaginal · 154 acquisitions, 13 frames shown]
[im 1/154]
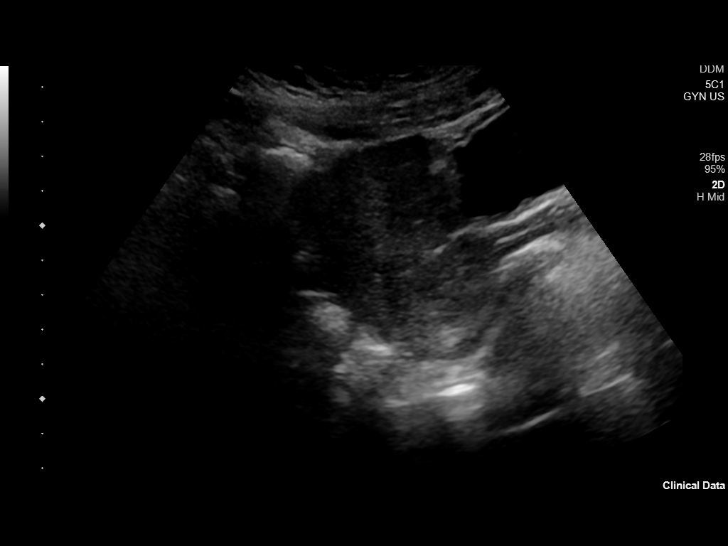
[im 13/154]
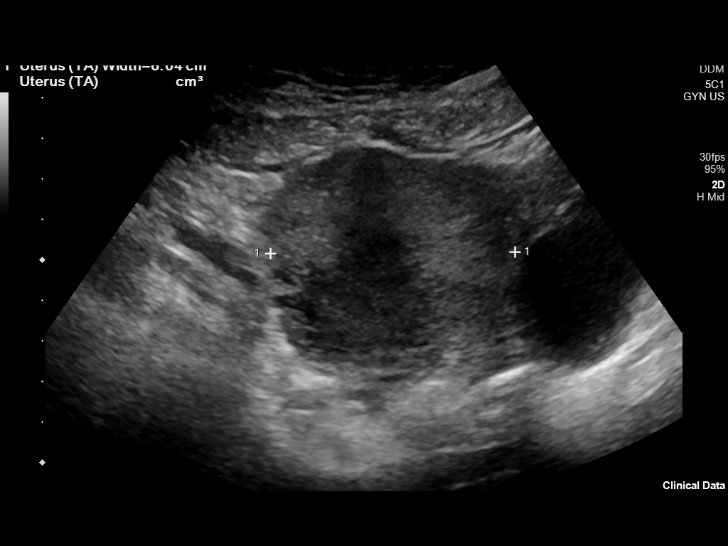
[im 26/154]
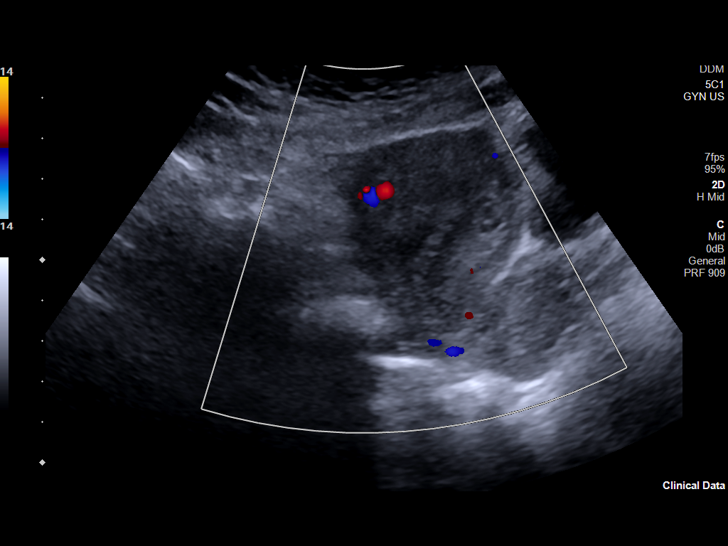
[im 39/154]
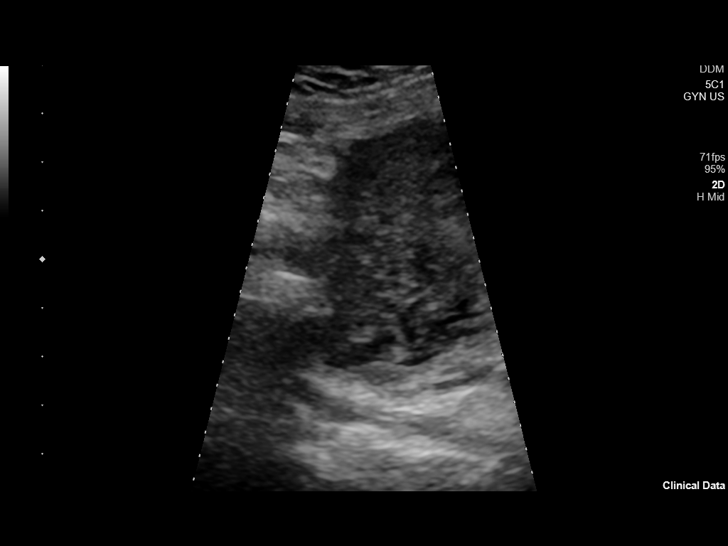
[im 52/154]
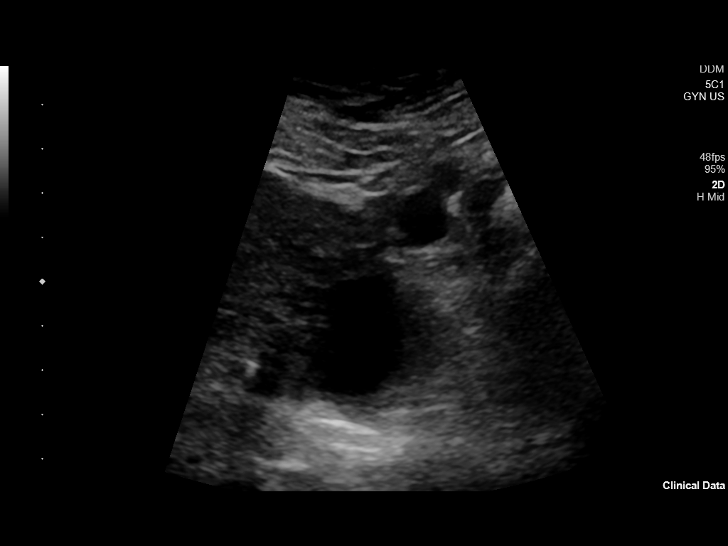
[im 64/154]
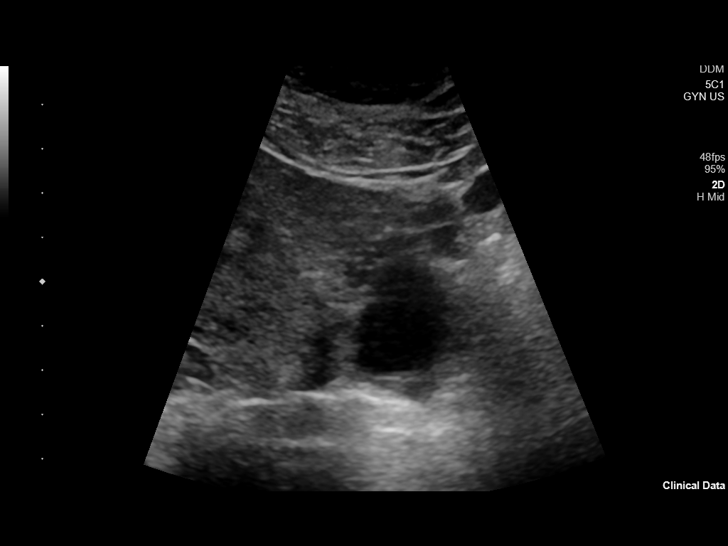
[im 77/154]
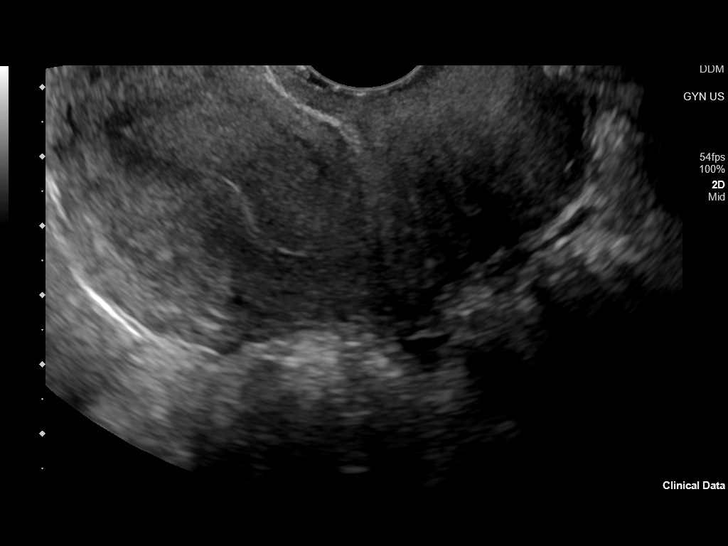
[im 90/154]
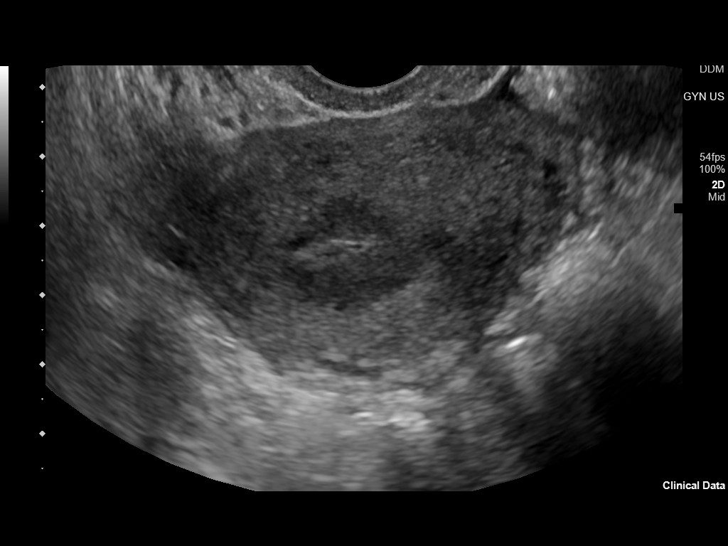
[im 103/154]
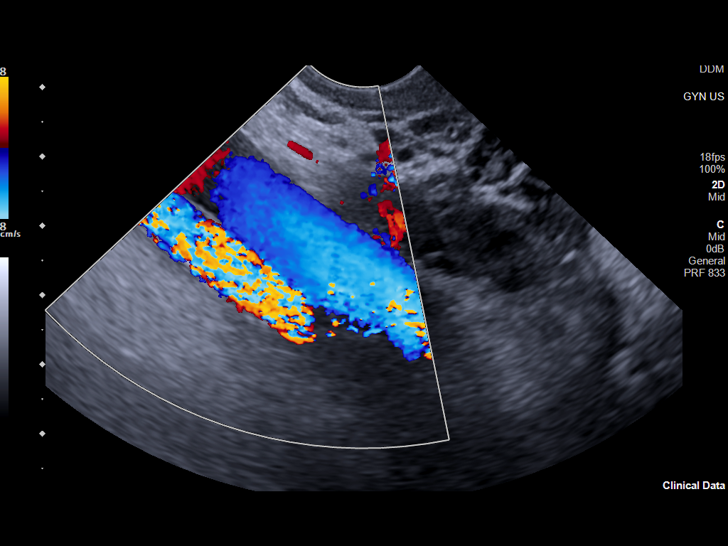
[im 115/154]
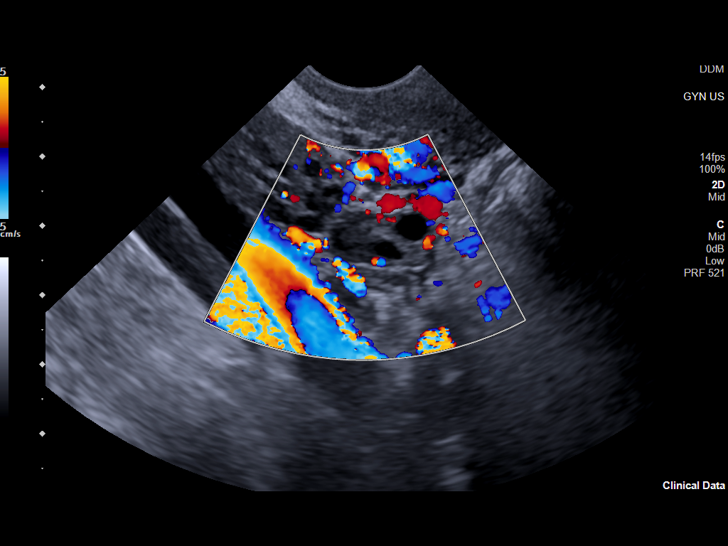
[im 128/154]
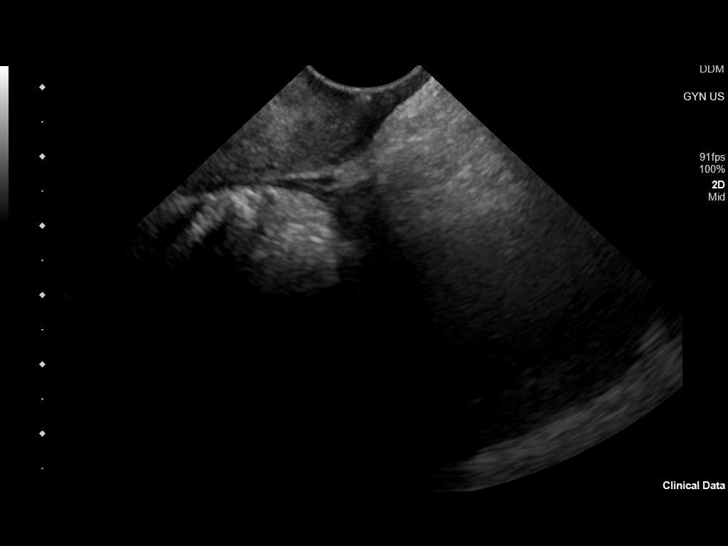
[im 141/154]
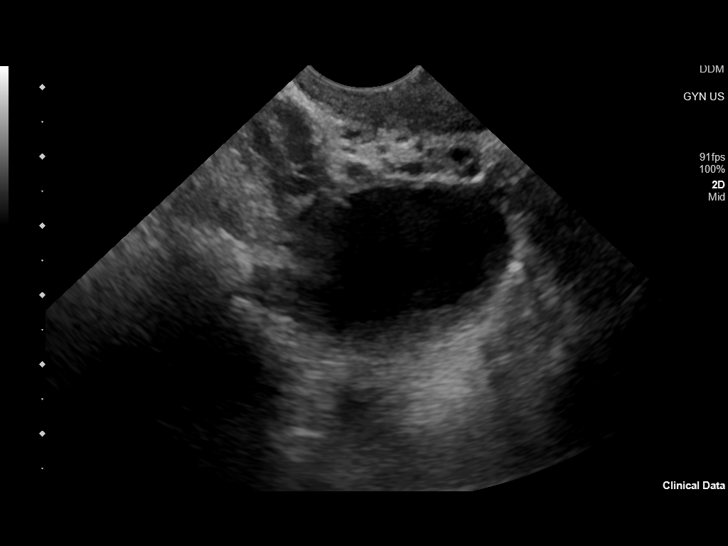
[im 154/154]
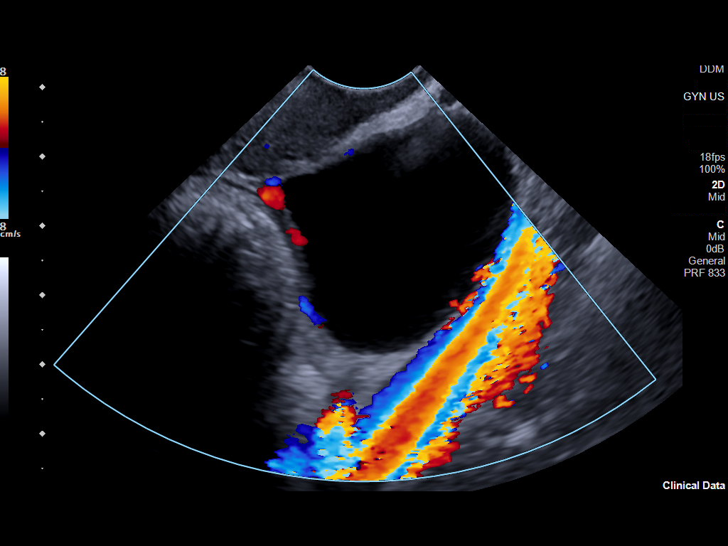

[13 of 25 positions shown; findings below may reference images not displayed]

FINDINGS: Uterus

Measurements: 7.9 x 3.9 x 5.9 cm = volume: 95.3 mL. No fibroids or
other mass visualized.

Endometrium

Thickness: 2 mm.  No focal abnormality visualized.

Right ovary

Measurements: 1.6 x 1.8 x 1.6 cm = volume: 2.5 mL. Normal
appearance/no adnexal mass. Normal blood flow with color Doppler.

Left ovary

Measurements: 4.1 x 3.5 x 3.4 cm = volume: 25.6 mL. There is a
simple appearing left ovarian cyst which measures 3.8 x 3.2 x
cm. No adnexal mass. Normal blood flow with color Doppler.

Other findings

No abnormal free fluid.
IMPRESSION: 1. No acute pelvic findings.
2. Simple appearing 4.1 cm left ovarian cyst. No follow up imaging
recommended. Note: This recommendation does not apply to
premenarchal patients or to those with increased risk (genetic,
family history, elevated tumor markers or other high-risk factors)
of ovarian cancer. Reference: Radiology [DATE]):[id number].
3. The uterus and right ovary appear normal.
